# Patient Record
Sex: Female | Born: 1985 | Race: White | Hispanic: No | Marital: Single | Smoking: Never smoker
Health system: Southern US, Community
[De-identification: ages and names within clinical notes are randomized; demographics above are authoritative.]

## PROBLEM LIST (undated history)

## (undated) DIAGNOSIS — F429 Obsessive-compulsive disorder, unspecified: Secondary | ICD-10-CM

## (undated) DIAGNOSIS — F419 Anxiety disorder, unspecified: Secondary | ICD-10-CM

## (undated) DIAGNOSIS — H04129 Dry eye syndrome of unspecified lacrimal gland: Secondary | ICD-10-CM

## (undated) DIAGNOSIS — Z87898 Personal history of other specified conditions: Secondary | ICD-10-CM

## (undated) DIAGNOSIS — L709 Acne, unspecified: Secondary | ICD-10-CM

## (undated) HISTORY — DX: Dry eye syndrome of unspecified lacrimal gland: H04.129

## (undated) HISTORY — DX: Anxiety disorder, unspecified: F41.9

## (undated) HISTORY — DX: Acne, unspecified: L70.9

## (undated) HISTORY — DX: Obsessive-compulsive disorder, unspecified: F42.9

## (undated) HISTORY — DX: Personal history of other specified conditions: Z87.898

---
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
## 2005-02-17 ENCOUNTER — Other Ambulatory Visit: Admission: RE | Admit: 2005-02-17 | Discharge: 2005-02-17 | Payer: Self-pay | Admitting: Obstetrics and Gynecology

## 2005-03-24 ENCOUNTER — Ambulatory Visit (HOSPITAL_COMMUNITY): Admission: RE | Admit: 2005-03-24 | Discharge: 2005-03-24 | Payer: Self-pay | Admitting: Neurosurgery

## 2005-07-27 ENCOUNTER — Inpatient Hospital Stay (HOSPITAL_COMMUNITY): Admission: EM | Admit: 2005-07-27 | Discharge: 2005-08-06 | Payer: Self-pay | Admitting: Emergency Medicine

## 2006-03-19 ENCOUNTER — Encounter (INDEPENDENT_AMBULATORY_CARE_PROVIDER_SITE_OTHER): Payer: Self-pay | Admitting: *Deleted

## 2006-03-19 ENCOUNTER — Ambulatory Visit (HOSPITAL_BASED_OUTPATIENT_CLINIC_OR_DEPARTMENT_OTHER): Admission: RE | Admit: 2006-03-19 | Discharge: 2006-03-19 | Payer: Self-pay | Admitting: Obstetrics and Gynecology

## 2007-09-08 IMAGING — CT CT MAXILLOFACIAL WO/W CM
2 of 11 series · 14 of 37 positions shown, 17 images · IV contrast (omnipaque)
Comparison: CT head from 07/27/05 and 08/01/05.

CLINICAL DATA: Status post rhinoplasty with suspected CSF leak.
LUMBAR MYELOGRAM:
Contrast:  8 ml Omnipaque 180 intrathecal.
TECHNIQUE: Multidetector CT imaging of the lumbar spine was performed after intrathecal injection of contrast.  Multiplanar CT image reconstructions were also generated.
TECHNIQUE: CT maxillofacial was done with the patient prone and the chin extended.  Coronal images only were obtained before and after the administration of intrathecal contrast.

[Series 105: l-spine helical · axial · 0.27mm/px · z∈[-367,-200]mm · 12 of 317 slices shown, 15 images]
[im 22/317  brain]
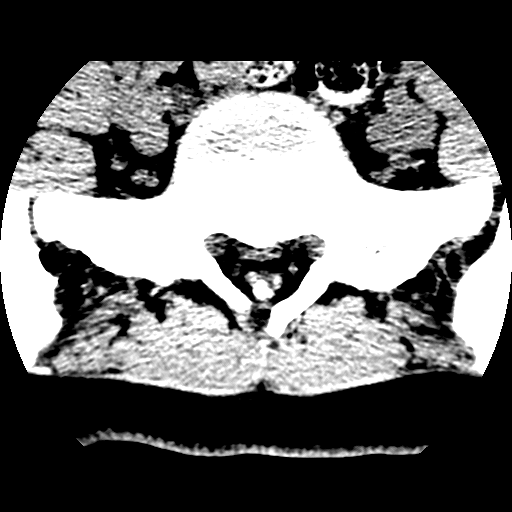
[im 22/317  bone]
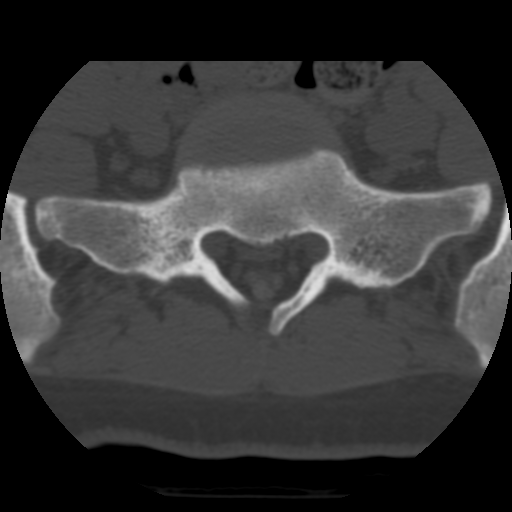
[im 43/317  bone]
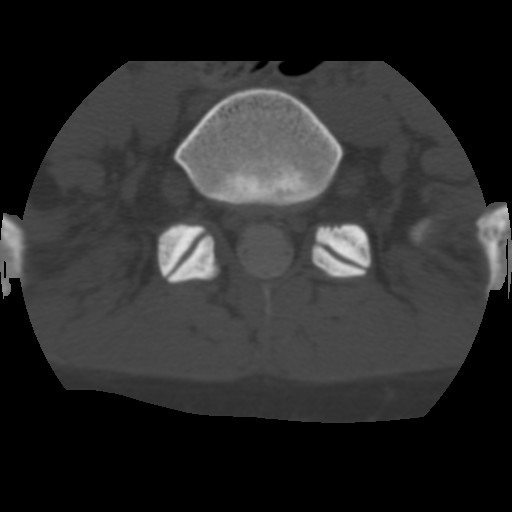
[im 64/317  bone]
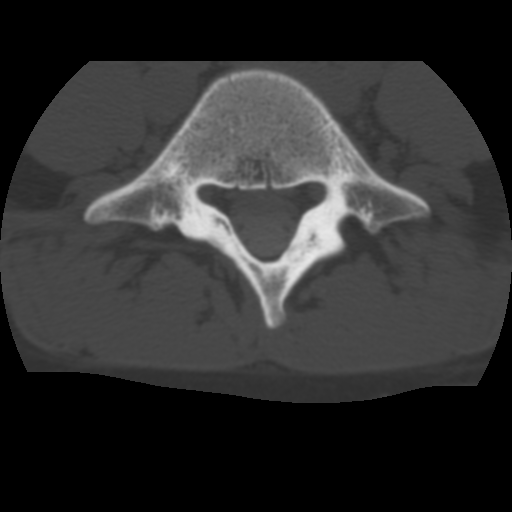
[im 106/317  bone]
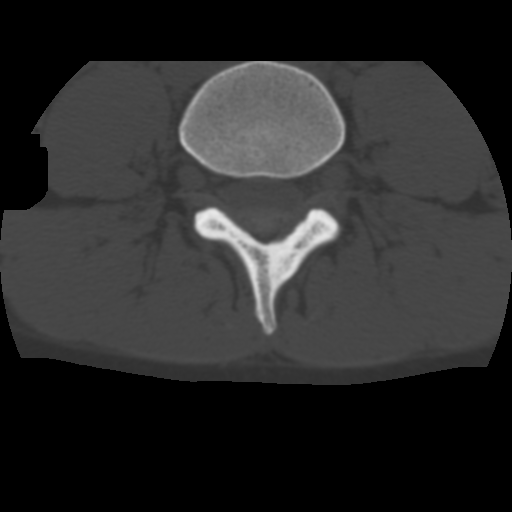
[im 127/317  brain]
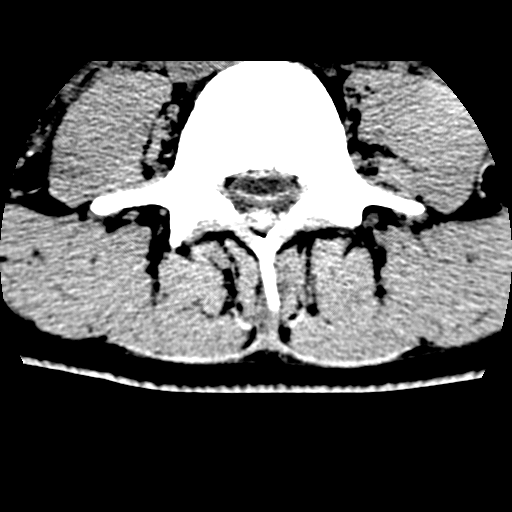
[im 127/317  bone]
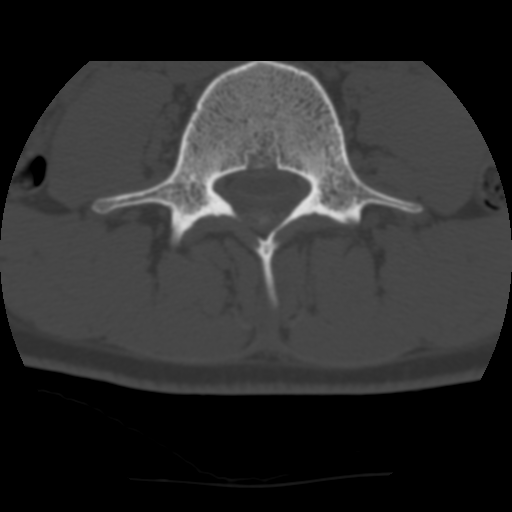
[im 148/317  bone]
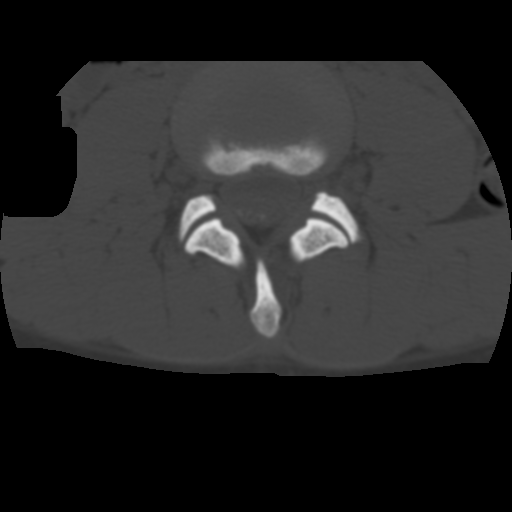
[im 169/317  bone]
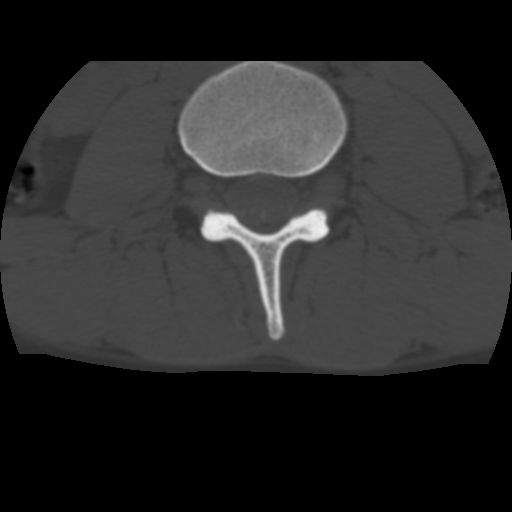
[im 190/317  bone]
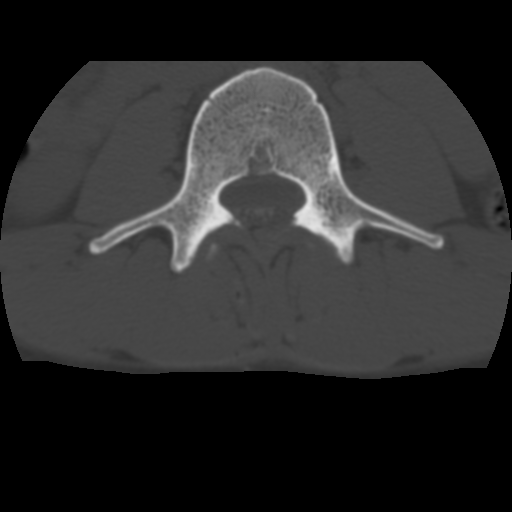
[im 211/317  brain]
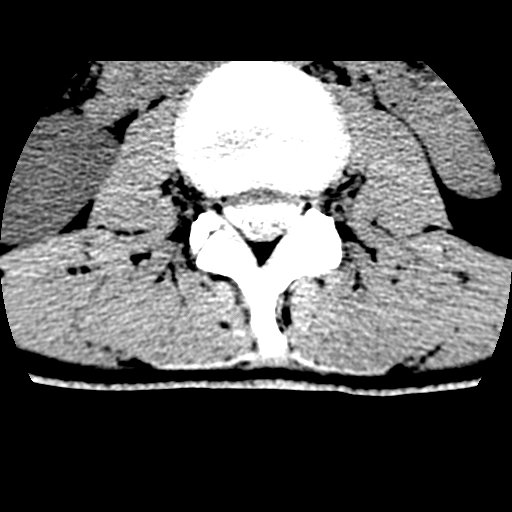
[im 211/317  bone]
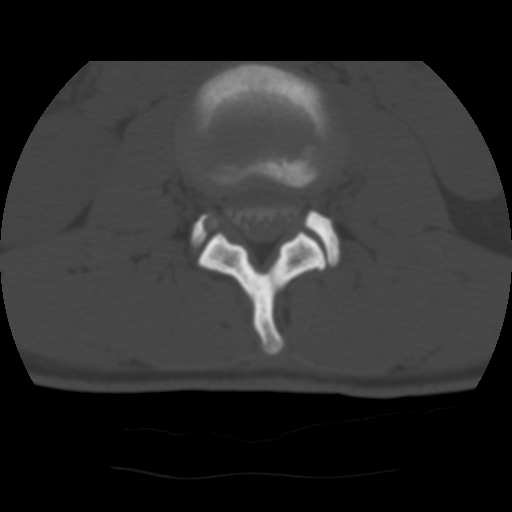
[im 253/317  bone]
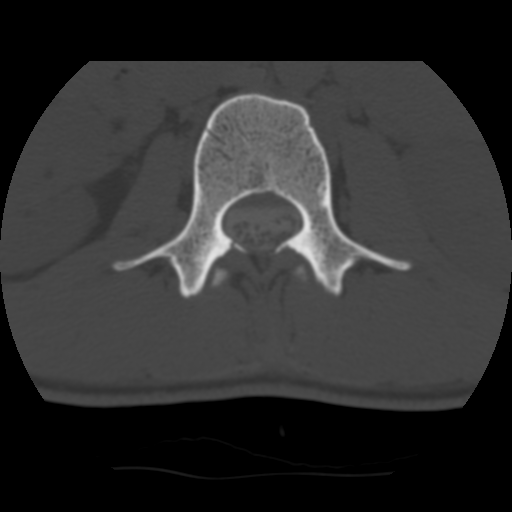
[im 274/317  bone]
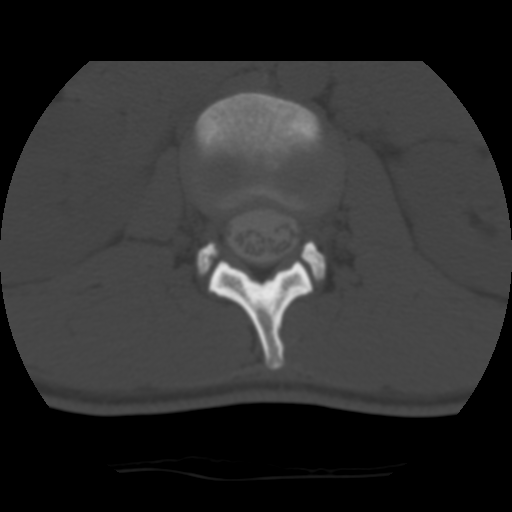
[im 295/317  bone]
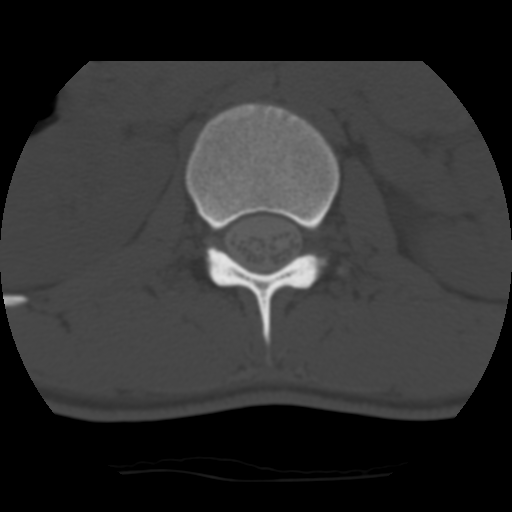

[Series 112: reformatted · sagittal · 0.38mm/px · 2 of 36 slices shown]
[im 12/36  bone]
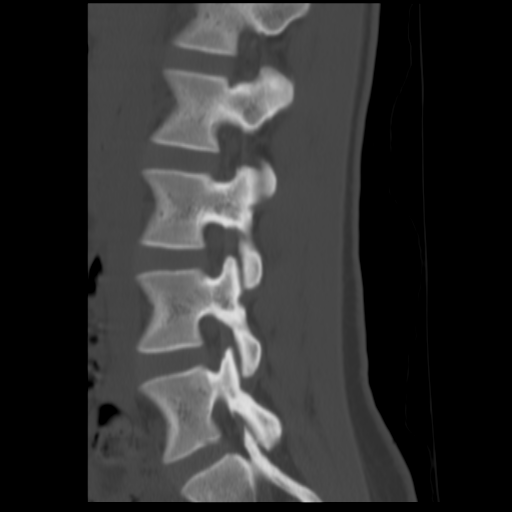
[im 24/36  bone]
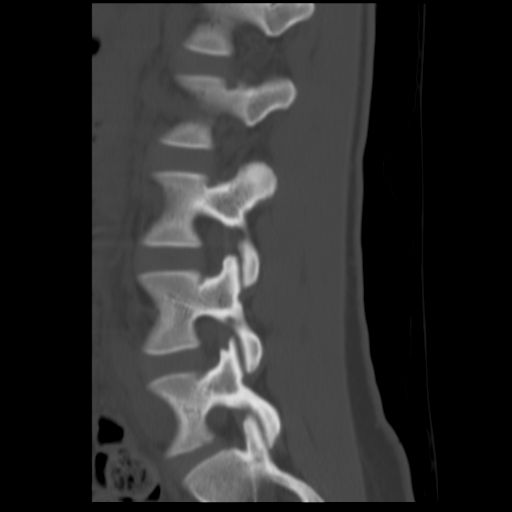

[14 of 37 positions shown; findings below may reference images not displayed]

FINDINGS: Lumbar myelography was performed to assess the possibility of spinal fluid leak from the lumbar drain site.  No leak was demonstrated.  The fluid was grossly bloody.  Four tubes were sent to the laboratory for analysis.  Contrast was maneuvered into the intracranial compartment for cisternography.  
CT LUMBAR SPINE WITH CONTRAST (POST-MYELOGRAM):
FINDINGS: There was good opacification of the subarachnoid space.  No spinal fluid leak was identified.
IMPRESSION: No evidence for lumbar CSF leak.
MAXILLOFACIAL CT WITHOUT AND WITH CONTRAST:
FINDINGS: Multiple areas of bony defect are seen in the cribriform plate bilaterally.  These are larger on the left than the right (image #105, series 102).  On the postcontrast images, there is evidence for leakage of spinal fluid into the ethmoid region (images #26 and #27, series 2).  The ethmoid cells are filled with fluid/blood which probably tamponades some of the CSF leak.  There is a bony spicule projecting superiorly on the left (images 101-107, series 102), likely tearing the dura and embedded into the frontal lobe.
Note is made of an air-fluid level in the left maxillary sinus.  The left frontal sinus is completely opacified.  There is significant fluid accumulation in the sphenoid with fracture of the median septum.  The bony nasal septum has been removed anteriorly.
IMPRESSION: 1.  Bilateral bony defects in the cribriform plate which are larger on the left than the right, as well as a superiorly projecting bone fragment on the left.
2.  Following opacification of the subarachnoid space with water soluble intrathecal contrast, there is evidence for leakage of spinal fluid bilaterally, more noticeable on the right.  
The report was called by myself to Dr. Pelobello at approximately [DATE] p.m. on 08/05/05.

## 2008-04-24 ENCOUNTER — Other Ambulatory Visit: Admission: RE | Admit: 2008-04-24 | Discharge: 2008-04-24 | Payer: Self-pay | Admitting: Obstetrics & Gynecology

## 2009-11-23 ENCOUNTER — Encounter: Admission: RE | Admit: 2009-11-23 | Discharge: 2009-11-23 | Payer: Self-pay | Admitting: Family Medicine

## 2010-12-23 NOTE — Discharge Summary (Signed)
Eileen Campbell, Eileen Campbell NO.:  [id number]   MEDICAL RECORD NO.:  [id number]          PATIENT TYPE:  INP   LOCATION:  3040                         FACILITY:  [facility]   PHYSICIAN:  Etter Sjogren, M.D.     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   HISTORY OF PRESENT ILLNESS:  This 25 year old woman complained of nasal  airway obstruction and nasal deformity. She had undergone a septorhinoplasty  two days prior to her admission here.  This was an outpatient procedure.  She had done very well from that although she did experience a fall at the  outpatient surgical center.  She went home, developed headache, and  developed some significant nausea with what her mother described as a  seizure.  She was asked to come back to the hospital where she was evaluated  and admitted for observation and placement of a lumbar drain.   HOSPITAL COURSE:  At the time of evaluation her CT scan did show some air  intracranially.  The actual site of the leak could not be identified.  Clinically she did not have any evidence of rhinorrhea.  Dr. Kerri Perches of  neurosurgery was consulted and he placed a lumbar drain.  She was placed on  bed rest for five days.  During that time she did have some headache, but  gradual steady clinical improvement.  After five days the drain was clamped.  She was out of bed to a chair and did fairly well, although she developed  some headache.  She was then transferred from the neurosurgery intensive  care unit to the floor.  On the floor she did develop some further headache  and some nausea.  This had gradually abated.  The lumbar drain was removed  and there was a question as to whether or not she was continuing to have  some leak either from that site  or from the cribriform plate area.  A  myelogram was performed and it was felt that there might be a leak in the  cribriform plate region although it is still possible that the area sealed.  She certainly has not had any clinical evidence of CSF rhinorrhea.  She  denies any salty taste in the back of her throat and there has not been any  fluid draining  from her nose.  She has been tested specifically for this  with bending forward and holding that position for several minutes.  To help  in the assessment, a consult was placed with Dr. Christia Reading of ENT.  He  saw her and discussed the clinical findings with her.  He has also  apparently consulted with Dr. Ronnald Ramp at [facility].  The feeling is that she would be safe to be discharged.  There has been no evidence of any infection and she could convalesce at home  safely.  The signs  and symptoms of meningitis have been discussed with the  family and the importance of communicating any evidence of a leak including  fluid draining from the nose or any salt taste in the back of the throat has  also been discussed.  The family would actually prefer to convalesce at home  rather than to spend further time here in the hospital at present.   Dr. Jenne Pane feels that there is a good chance this will go on to complete  healing.   PLAN:  Followup with Dr. Ronnald Ramp at [facility].  Dr.  Jenne Pane will be helping with those arrangements.  She will go home, no  straining.  She will be ambulatory, but no lifting, no activity, no  exertion, and  no straining.  She will be on a soft diet.  She will be discharged on  Darvocet-N 100 total of 30 given 1 p.o. q.4-6h. prior pain and Phenergan 25  mg a total of 15 given, one p.o. q.4h. p.r.n. nausea.  She will followup  with me in a couple of weeks.      Etter Sjogren, M.D.  Electronically Signed     DB/MEDQ  D:  08/06/2005  T:  08/07/2005  Job:   [id number]

## 2010-12-23 NOTE — Consult Note (Signed)
[name], Campbell NO.:  [id number]   MEDICAL RECORD NO.:  [id number]          PATIENT TYPE:  INP   LOCATION:  3040                         FACILITY:  [facility]   PHYSICIAN:  Antony Contras, MD     DATE OF BIRTH:  12/20/1985   DATE OF CONSULTATION:  08/05/2005  DATE OF DISCHARGE:  08/06/2005                                   CONSULTATION   CHIEF COMPLAINT:  Anterior skull base fracture following septoplasty.   HISTORY OF PRESENT ILLNESS:  The patient is a 25 year old white female who  underwent septorhinoplasty for septal deviation on December 19.  Two days  following the surgery, she had a seizure-like episode associated with  headache and came to the emergency department.  CT scanning of the head  revealed pneumocephalus and an apparent fracture of the cribriform plate.  A  neurosurgery consultation was obtained and she was admitted to the hospital.  She does state that she noted some drainage from her nose postoperatively  that initially was pink and then became yellow in color.  She thought this  was normal following surgery.  Once admitted, a lumbar drain was placed and  she was kept on bedrest and started on stool softener.  The lumbar drain was  kept in place for 5 days and was then removed.  Imaging of the head showed  improvement of pneumocephalus.  Since that time, she has had continued  headache initially upon sitting up.  A blood patch was performed at the  lumbar site.  Since that time, her headache has been better but has been  more steady today.  Because of continued headache, a CT of the face and  myelogram were performed today that continued to demonstrate a defect in the  cribriform plate, more so on the left but also slightly on the right side.  The myelogram shows what looks to be penetration of CSF fluid in through the  right-sided fracture and less so on the left side where there is soft tissue  density on the scan.  However, the patient has  not notices any salty taste  in her throat and notices no rhinorrhea.  Her spinal fluid was sent for  laboratories that show no evidence of meningitis but only red blood cells.  A consultation was called for consideration of a nasal approach for repair  of the cribriform plate.   PAST MEDICAL HISTORY:  None.   PAST SURGICAL HISTORY:  As above.   MEDICATIONS:  1.  Senokot.  2.  Colace.  3.  Zofran.  4.  Phenergan.  5.  Hydrocodone.   ALLERGIES:  DOXYCYCLINE.   FAMILY HISTORY:  Noncontributory.   SOCIAL HISTORY:  She denies smoking or alcohol use.   PHYSICAL EXAMINATION:  VITAL SIGNS:  Afebrile and vital signs stable.  GENERAL:  The patient is alert and in no acute distress.  She is interactive  and alert and oriented.  She is in a 30-degree head-up position but remains  in bed during the examination.  HEENT:  She has no nasal dressing on and has no bleeding from the  nose.  She  was placed into a sitting position with her head leaning forward for about 5  minutes and no rhinorrhea was demonstrated.  She did feel a feeling of  wetness inside of her nose but nothing drained out.   RADIOLOGIC EXAMS:  The CT of the face performed today along with myelogram  were personally reviewed and discussed with radiology.  This demonstrated a  sizeable left cribriform plate defect as well as a smaller defect on the  right side.  There is a fragment of bone sticking up on the left side  laterally and the crista galli and stability of the crista galli is unclear.  On myelogram, contrast appears to penetrate the right side more than the  left side where there is soft tissue density.   ASSESSMENT:  Eileen Campbell is a 25 year old white female with anterior skull  base defect following septoplasty with apparent cerebrospinal fluid leak,  now treated with 5 days of a lumbar drain and now with imaging suggesting  continued leak, however, without clinical signs.   PLAN:  Options were discussed at  length with the patient and her family.  Most CSF leaks following sinus or nasal surgery will heal with time or  lumbar drain at times.  Her defect does seem large with soft tissue density  on the left side that may represent dura or brain but also could represent  healing soft tissue.  Certainly with time, the chances of spontaneous  healing do decline, however, it is unclear whether she has a leak going on  at this time or not.  One option would be to continue observing the  situation, keeping the head of her bed elevated and maximizing bedrest.  This may allow the leak to seal off and would have a low risk of delayed  treatment and a very low risk of meningitis.  Surgical options include a  craniotomy with repair from above which would be the most definitive repair  but also associated with significant risk.  Repair with a nasal endoscopic  approach is possible, however, it is difficult to perform on the acutely  operated on nose and with the sizeable defect that possibly involves both  sides of the cribriform plate, the ability to isolate the defect for fascia  patching may be impossible from below.  At this point in time, I have  advised the patient and her family that continued observation with bedrest  and elevated head are low-risk options that still may result in healing.  In  the meantime, I will discuss the case with Dr. Pincus Badder at [facility] when he is available during this holiday  week to see if he would be interested in attempting repair through the nose  and to try to get an idea of proper timing of this repair.  Continued  waiting may result in an isolation of the CSF leak, making repair more  readily performed.  I will discuss these thoughts with Dr. Odis Luster and Dr.  Jeral Fruit and have talked at length with the family already.      Antony Contras, MD  Electronically Signed    DDB/MEDQ  D:  08/05/2005  T:  08/07/2005  Job:   [id number]

## 2010-12-23 NOTE — Op Note (Signed)
Eileen Campbell, Eileen Campbell            ACCOUNT NO.:  [id number]   MEDICAL RECORD NO.:  [id number]          PATIENT TYPE:  AMB   LOCATION:  NESC                         FACILITY:  [facility]   PHYSICIAN:  Cynthia P. Romine, M.D.DATE OF BIRTH:  05/09/1986   DATE OF PROCEDURE:  03/19/2006  DATE OF DISCHARGE:                                 OPERATIVE REPORT   PREOPERATIVE DIAGNOSIS:  Introital pain with tampon insertion, rule out  hymenal band, inadequate office exam.   POSTOPERATIVE DIAGNOSIS:  Normal examination.   PROCEDURE:  Examination under anesthesia and Pap smear.   SURGEON:  Dr. Arline Asp Romine.   ANESTHESIA:  General by LMA.   ESTIMATED BLOOD LOSS:  None.   COMPLICATIONS:  None.   PROCEDURE:  The patient was taken to the operating room and, after the  induction of adequate general anesthesia by LMA, she was placed in dorsal  lithotomy position, and gently prepped and draped in usual fashion.  Examination was carried out by taking a cotton swab and inserting it into  the introitus, and sweeping the introitus looking for any hymenal band.  None was noted.  The fifth finger of the operator's hand was then inserted  into the vagina, and the introitus and the vagina were palpated and felt to  be normal.  The patient was currently menstruating.  A small Peterson  speculum was inserted and the introitus and the vagina and the cervix were  visualized.  There was blood in the vault consistent with menstruation, and  the exam was otherwise normal.  A Scopette was used to remove the blood from  the posterior fornix, and a Pap smear was taken.  A bimanual examination was  then carried out.  The uterus was anterior, small and mobile.  The adnexa  were without masses.  One more time the examiner carefully examined the  introitus and again no hymenal band could be identified.  Procedure was  terminated.  The patient was taken to recovery room in satisfactory  condition.     ______________________________  Edwena Felty. Romine, M.D.     CPR/MEDQ  D:  03/19/2006  T:  03/19/2006  Job:  [id number]   cc:   Aram Beecham P. Romine, M.D.  Fax: [phone]

## 2010-12-23 NOTE — Op Note (Signed)
[name], [name]            ACCOUNT NO.:  [id number]   MEDICAL RECORD NO.:  [id number]          PATIENT TYPE:  INP   LOCATION:  3102                         FACILITY:  [facility]   PHYSICIAN:  Danae Orleans. Venetia Maxon, M.D.  DATE OF BIRTH:  1985/09/19   DATE OF PROCEDURE:  07/27/2005  DATE OF DISCHARGE:                                 OPERATIVE REPORT   PREOPERATIVE DIAGNOSIS:  Cerebrospinal fluid leak with base of skull  fracture.   POSTOPERATIVE DIAGNOSIS:  Cerebrospinal fluid leak with base of skull  fracture.   PROCEDURE:  Placement of lumbar drain.   SURGEON:  Danae Orleans. Venetia Maxon, M.D.   ANESTHESIA:  Sedation with local lidocaine.   COMPLICATIONS:  None.   DISPOSITION:  Recovery.   INDICATIONS:  Eileen Campbell is a 25 year old young woman who had a  septorhinoplasty and developed a headache and had a head CT which  demonstrated intracranial air and suggestion of a cribriform plate fracture.  It was elected to admit her to the hospital to the neuro ICU and to place a  lumbar drain for CSF diversion to allow the source of the leak to seal  without surgery. It was elected to take the patient to the operating room  for placement of the lumbar drain given her age and relative level of  anxiety.   PROCEDURE:  Eileen Campbell was brought to the operating room. Following the  initiation of intravenous sedation, she was placed in a left lateral  decubitus position with head tucked and knees pulled toward her chest. Her  low back was prepped and draped in the usual sterile fashion. After  infiltrating the skin and subcutaneous tissues with local lidocaine 1%  without epinephrine, a Tuohy needle was inserted into the thecal sac with  brisk return of CSF and a lumbar drain which had been previously assembled  with the stylet was placed without difficulty through the Tuohy needle and  threaded in the thecal sac. The patient had no discomfort while this was  being done. The needle was then  withdrawn and the stylet was withdrawn. The  drain was anchored with a single silk stitch and the remaining drain was  circularized and a sterile occlusive dressing was placed. The drain was then  hooked up to the drainage system and after the patient was transferred back  to the bed. The patient tolerated the procedure well without complications.      Danae Orleans. Venetia Maxon, M.D.  Electronically Signed     JDS/MEDQ  D:  07/27/2005  T:  07/30/2005  Job:  [id number]

## 2010-12-23 NOTE — Consult Note (Signed)
Eileen, Campbell            ACCOUNT NO.:  [id number]   MEDICAL RECORD NO.:  [id number]          PATIENT TYPE:  INP   LOCATION:  3102                         FACILITY:  [facility]   PHYSICIAN:  Danae Orleans. Venetia Maxon, M.D.  DATE OF BIRTH:  1985-09-20   DATE OF CONSULTATION:  07/27/2005  DATE OF DISCHARGE:                                   CONSULTATION   REASON FOR CONSULTATION:  Intracranial air following septorhinoplasty.   HISTORY OF ILLNESS:  Eileen Campbell is a 25 year old young woman status  post septorhinoplasty two days ago complaining of headache and had two  episodes of loss of consciousness which may have been vasovagal or seizure-  type events, which were associated with abnormal movement of both upper and  lower extremities and apparently rolling her eyes back in her head.  She did  not have any loss of bowel or bladder control and did not bite her tongue.  She apparently aroused pretty readily after these events with no evidence of  any postictal.  She complains of headache and she came to the emergency room  today for evaluation.  A head CT was obtained which showed a significant  amount of intracranial air with questionable cribriform plate fracture.   The patient is awake, alert and oriented on examination.  Speech is clear  and fluent speech.  She has intact short- and long-term memory.  Pupils are  equal, round and reactive to light.  Extraocular movements intact.  She  moves all extremities without any apparent drift.  Her neck is supple.  She  has mild photophobia.  I did not sit her up and lean her forward to see if  she leaks CSF.  She does note some fluid draining along the back of her  throat.   IMPRESSION:  Eileen Campbell is a 25 year old young woman with probable  cribriform plate fracture with CSF leak.  She is to have a trial of lumbar  drainage for approximately five days.  If no resolution of intracranial air,  then she will need fine-cut CT scan of the  skull base with possible  craniotomy and repair of the frontal floor.  I discussed with the patient  and with her parents who wish to proceed.  I would not start her on  antiepileptic medications at the present time as it is not clear that she  indeed had a seizure but that if she has a witnessed seizure, we will go  ahead with anticonvulsants.  I discussed this with Dr. Sharene Skeans who made  that recommendation.  The patient will be given prophylactic Ancef for her  drain and nasal splints and will not remove the nasal splints before their  earlier planned removal.  Will keep her head of bed at 30 degrees with a  lumbar drain and plan on five days' of lumbar drainage.      Danae Orleans. Venetia Maxon, M.D.  Electronically Signed     JDS/MEDQ  D:  07/27/2005  T:  07/30/2005  Job:  [id number]   cc:   Etter Sjogren, M.D.  Fax: [phone]

## 2010-12-23 NOTE — H&P (Signed)
NAMETISHIA, MAESTRE NO.:  [id number]   MEDICAL RECORD NO.:  [id number]          PATIENT TYPE:  INP   LOCATION:  3102                         FACILITY:  [facility]   PHYSICIAN:  Etter Sjogren, M.D.     DATE OF BIRTH:  01/19/1986   DATE OF ADMISSION:  07/27/2005  DATE OF DISCHARGE:                                HISTORY & PHYSICAL   CHIEF COMPLAINT:  Headache and seizure at home this morning.   HISTORY OF PRESENT ILLNESS:  A 25 year old woman that had a septorhinoplasty  2 days ago. She had done well but apparently had a seizure at home this  morning, according to her mom. She has had nausea yet feels hungry. She  complains of a headache. There is no visual disturbance. She has had very  minimal nasal drainage. She was asked to come to the emergency room for  evaluation.   PAST MEDICAL HISTORY:  Negative cardiac, renal, GI, pulmonary, diabetes,  hypertension.   PHYSICAL EXAMINATION:  GENERAL:  She is alert, oriented.  HEENT:  PERRL. EOMI. Nasal tapes are in place. There is minimal drainage.  LUNGS:  Clear to auscultation. Breath sounds equal bilaterally.  HEART:  Regular rate and rhythm without murmur. S1, S2 within normal limits.  ABDOMEN:  Benign.  EXTREMITIES:  Full range of motion, equal strength.   Review of CT shows some air within the cranium, possibly with a crack at the  left cribriform plate.   The plan is to admit for observation. Neurosurgical consultation has been  obtained and have discussed her case with Dr. Kerri Perches. He will see her  later today, but he is recommending admission, observation, and placement of  a lumbar drain.      Etter Sjogren, M.D.  Electronically Signed     DB/MEDQ  D:  07/27/2005  T:  07/28/2005  Job:  [id number]

## 2016-05-16 DIAGNOSIS — H04129 Dry eye syndrome of unspecified lacrimal gland: Secondary | ICD-10-CM

## 2016-05-16 DIAGNOSIS — Z87898 Personal history of other specified conditions: Secondary | ICD-10-CM

## 2016-05-16 DIAGNOSIS — F419 Anxiety disorder, unspecified: Secondary | ICD-10-CM

## 2016-05-16 DIAGNOSIS — L709 Acne, unspecified: Secondary | ICD-10-CM

## 2016-05-16 DIAGNOSIS — F429 Obsessive-compulsive disorder, unspecified: Secondary | ICD-10-CM

## 2016-05-29 ENCOUNTER — Ambulatory Visit: Payer: Self-pay | Admitting: Cardiovascular Disease

## 2016-06-21 ENCOUNTER — Encounter: Payer: Self-pay | Admitting: Cardiovascular Disease

## 2016-06-21 ENCOUNTER — Encounter (INDEPENDENT_AMBULATORY_CARE_PROVIDER_SITE_OTHER): Payer: Self-pay

## 2016-06-21 ENCOUNTER — Ambulatory Visit (INDEPENDENT_AMBULATORY_CARE_PROVIDER_SITE_OTHER): Payer: 59 | Admitting: Cardiovascular Disease

## 2016-06-21 VITALS — BP 124/68 | HR 63 | Ht 68.0 in | Wt 120.4 lb

## 2016-06-21 DIAGNOSIS — R002 Palpitations: Secondary | ICD-10-CM | POA: Insufficient documentation

## 2016-06-21 NOTE — Progress Notes (Signed)
Cardiology Office Note   Date:  06/21/2016   ID:  Eileen Campbell, DOB [birth year], MRN [id number]  PCP:  Boneta LucksJennifer Brown, NP  Cardiologist:   Kristeen MissPhilip Latysha Thackston, MD   Chief Complaint  Patient presents with  . Loss of Consciousness   Problem list 1. History of syncope 2. Anxiety 3. Obsessive-compulsive disorder   History of Present Illness: Eileen Campbell is a 30 y.o. female who presents for further evaluations of some palpitations and syncope/presyncope.   She has seen Dr. Donnie Ahoilley in the past.  She was having a "sinking " sensation that started 3 years ago .  The episodes last 2-3 seconds , then goes away.   May have some increased anxiety with the episode but no CP or dyspnea . 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   Not associated with her periods.   Seems to occur during the day or early evening .    She does not exercise regularly .  She does do sprints on occasion (does 1 minute sprints ,  Lifts weights on occasion )   Not related to ETOH intake  Drinks lots of coffee - at least 2 shots of coffee every am and then  Drinks regular coffee through the day   Does not sleep or eat on  A regular schedule    Past Medical History:  Diagnosis Date  . Acne   . Anxiety   . Dry eye syndrome   . History of syncope   . Obsessive compulsive disorder     History reviewed. No pertinent surgical history.   Current Outpatient Prescriptions  Medication Sig Dispense Refill  . clonazePAM (KLONOPIN) 0.5 MG tablet Take 0.25 mg by mouth every morning. Take 1 tablet by mouth at night    . Desvenlafaxine Succinate ER (PRISTIQ) 25 MG TB24 Take 75 mg by mouth every morning.    Marland Kitchen. omeprazole (PRILOSEC) 40 MG capsule Take 40 mg by mouth daily as needed.    Marland Kitchen. spironolactone (ALDACTONE) 50 MG tablet Take 50 mg by mouth daily.      Marland Kitchen. tretinoin (RETIN-A) 0.05 % cream Apply 1 application topically at bedtime. To face     No current facility-administered medications for this visit.     Allergies:   Doxycycline    Social History:  The patient  reports that she has never smoked. She has never used smokeless tobacco.   Family History:  The patient's family history includes Diabetes in her father; Heart attack in her father; Hypertension in her father.    ROS:  Please see the history of present illness.    Review of Systems: Constitutional:  denies fever, chills, diaphoresis, appetite change and fatigue.  HEENT: denies photophobia, eye pain, redness, hearing loss, ear pain, congestion, sore throat, rhinorrhea, sneezing, neck pain, neck stiffness and tinnitus.  Respiratory: denies SOB, DOE, cough, chest tightness, and wheezing.  Cardiovascular: admits to chest pain, palpitations and leg swelling.  Gastrointestinal: denies nausea, vomiting, abdominal pain, diarrhea, constipation, blood in stool.  Genitourinary: denies dysuria, urgency, frequency, hematuria, flank pain and difficulty urinating.  Musculoskeletal: denies  myalgias, back pain, joint swelling, arthralgias and gait problem.   Skin: denies pallor, rash and wound.  Neurological: denies dizziness, seizures, syncope, weakness, light-headedness, numbness and headaches.   Hematological: denies  adenopathy, easy bruising, personal or family bleeding history.  Psychiatric/ Behavioral: denies suicidal ideation, mood changes, confusion, nervousness, sleep disturbance and agitation.       All other systems are reviewed and negative.    PHYSICAL EXAM: VS:  BP 124/68   Pulse 63   Ht 5\' 8"  (1.727 m)   Wt 120 lb 6.4 oz (54.6 kg)   BMI 18.31 kg/m  , BMI Body mass index is 18.31 kg/m. GEN: Well nourished, well developed, in no acute distress  HEENT: normal  Neck: no JVD, carotid bruits, or masses Cardiac: RRR; soft systolic murmur , rubs, or gallops,no edema   Respiratory:  clear to auscultation bilaterally, normal work of breathing GI: soft, nontender, nondistended, + BS MS: no deformity or atrophy  Skin: warm and dry, no rash Neuro:  Strength and sensation are intact Psych: normal   EKG:  EKG is ordered today. The ekg ordered today demonstrates   NSR at 63.   RAD    Recent Labs: No results found for requested labs within last 8760 hours.    Lipid Panel No results found for: CHOL, TRIG, HDL, CHOLHDL, VLDL, LDLCALC, LDLDIRECT    Wt Readings from Last 3 Encounters:  06/21/16 120 lb 6.4 oz (54.6 kg)      Other studies Reviewed: Additional studies/ records that were reviewed today include: . Review of the above records demonstrates:    ASSESSMENT AND PLAN:  1.  Palpitations: Eileen Campbell  presents with palpitations that sound like premature ventricular contractions or perhaps premature atrial contractions. She works very hectic and un-regimented work and life schedule. 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  I've recommended that she make sure that she's getting enough salt and protein in her diet. I've recommended that she drink V8 juice every morning.  I've recommended that she try to sleep for 7 date hours at night on a regular basis We'll check a TSH today.  The palpitations improved with these measures then we will not have to do anything further. We'll discuss this further in 3 months.  I'll see her again in 3 months for a follow-up visit.   Current medicines are reviewed at length with the patient today.  The patient does not have concerns regarding medicines.  Labs/ tests ordered today include:  No orders of the defined types were placed in this encounter.    Disposition:   FU with 3 months      Kristeen MissPhilip Alyjah Lovingood, MD  06/21/2016 3:02 PM    [facility] [address], [place], [place]  [id number] Phone: [phone]; Fax: [phone]

## 2016-06-21 NOTE — Patient Instructions (Addendum)
Increase your intake of fluids (water with electrolyte tabs like Nun tablets, or gatorade) , protein ( hard boiled eggs, chicken, fish) , and a electrolytes ( V-8 juice, salt, potassium chloride  which is sold as No-Salt  Medication Instructions:  Your physician recommends that you continue on your current medications as directed. Please refer to the Current Medication list given to you today.   Labwork: TODAY - Thyroid stimulating hormone   Testing/Procedures: None Ordered   Follow-Up: Your physician recommends that you schedule a follow-up appointment in: 3 months with Dr. Elease HashimotoNahser.    If you need a refill on your cardiac medications before your next appointment, please call your pharmacy.   Thank you for choosing [facility]! Eligha BridegroomMichelle Swinyer, RN [phone]

## 2016-06-22 LAB — TSH: TSH: 1.06 mIU/L

## 2016-07-06 ENCOUNTER — Telehealth: Payer: Self-pay | Admitting: Cardiovascular Disease

## 2016-07-06 ENCOUNTER — Other Ambulatory Visit: Payer: Self-pay

## 2016-07-06 ENCOUNTER — Other Ambulatory Visit: Payer: 59 | Admitting: *Deleted

## 2016-07-06 DIAGNOSIS — R002 Palpitations: Secondary | ICD-10-CM

## 2016-07-06 LAB — BASIC METABOLIC PANEL
BUN: 14 mg/dL (ref 7–25)
CO2: 24 mmol/L (ref 20–31)
Calcium: 9.4 mg/dL (ref 8.6–10.2)
Chloride: 105 mmol/L (ref 98–110)
Creat: 0.77 mg/dL (ref 0.50–1.10)
Glucose, Bld: 80 mg/dL (ref 65–99)
Potassium: 4.2 mmol/L (ref 3.5–5.3)
Sodium: 138 mmol/L (ref 135–146)

## 2016-07-06 NOTE — Telephone Encounter (Signed)
New Message °

## 2016-07-06 NOTE — Telephone Encounter (Signed)
Desk called and said patient called and asked if she needed labs today. Ordered a BMET for her today per notes on 11/16 and 11/17 (see below). Told them to tell her to come today to get her labs.  Notes Recorded by Levi AlandMichelle M Swinyer, RN on 06/23/2016 at 11:52 AM EST Reviewed results with patient who verbalized understanding. She states she will call back next week to schedule lab appointment and is aware to make that appointment for 2-3 weeks. She thanked me for the call. ------  Notes Recorded by Levi AlandMichelle M Swinyer, RN on 06/22/2016 at 12:08 PM EST Attempted to call patient w/result, unable to leave message. Patient needs to be scheduled for bmet in 2-3 weeks per Dr. Elease HashimotoNahser for evaluation of K+ level. ------

## 2016-07-13 ENCOUNTER — Encounter: Payer: Self-pay | Admitting: Nurse Practitioner

## 2016-07-14 ENCOUNTER — Telehealth: Payer: Self-pay | Admitting: Cardiovascular Disease

## 2016-07-14 NOTE — Telephone Encounter (Signed)
Eileen Campbell is returning a call . Thanks

## 2016-07-17 NOTE — Telephone Encounter (Signed)
Results of bmet reviewed with patient who verbalized understanding and agreement to continue current medications.

## 2016-10-02 ENCOUNTER — Ambulatory Visit: Payer: 59 | Admitting: Cardiovascular Disease

## 2016-10-03 ENCOUNTER — Encounter: Payer: Self-pay | Admitting: Cardiovascular Disease

## 2017-01-16 ENCOUNTER — Ambulatory Visit (INDEPENDENT_AMBULATORY_CARE_PROVIDER_SITE_OTHER): Payer: 59 | Admitting: Physician Assistant

## 2017-01-16 ENCOUNTER — Encounter: Payer: Self-pay | Admitting: Physician Assistant

## 2017-01-16 VITALS — BP 107/67 | HR 82 | Temp 98.4°F | Resp 18 | Ht 68.0 in | Wt 131.2 lb

## 2017-01-16 DIAGNOSIS — Z79899 Other long term (current) drug therapy: Secondary | ICD-10-CM | POA: Diagnosis not present

## 2017-01-16 DIAGNOSIS — F429 Obsessive-compulsive disorder, unspecified: Secondary | ICD-10-CM

## 2017-01-16 DIAGNOSIS — G44221 Chronic tension-type headache, intractable: Secondary | ICD-10-CM | POA: Diagnosis not present

## 2017-01-16 DIAGNOSIS — F419 Anxiety disorder, unspecified: Secondary | ICD-10-CM | POA: Diagnosis not present

## 2017-01-16 MED ORDER — DESVENLAFAXINE SUCCINATE ER 100 MG PO TB24: 200.0000 mg | ORAL_TABLET | Freq: Every day | ORAL | 0 refills | Status: DC
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
## 2017-01-16 NOTE — Patient Instructions (Signed)
     IF you received an x-ray today, you will receive an invoice from [facility]. Please contact  Radiology at [phone] with questions or concerns regarding your invoice.   IF you received labwork today, you will receive an invoice from [facility]. Please contact [facility] at [phone] with questions or concerns regarding your invoice.   Our billing staff will not be able to assist you with questions regarding bills from these companies.  You will be contacted with the lab results as soon as they are available. The fastest way to get your results is to activate your My Chart account. Instructions are located on the last page of this paperwork. If you have not heard from us regarding the results in 2 weeks, please contact this office.     

## 2017-04-17 ENCOUNTER — Ambulatory Visit: Payer: 59 | Admitting: Physician Assistant

## 2017-04-29 ENCOUNTER — Other Ambulatory Visit: Payer: Self-pay | Admitting: Physician Assistant

## 2017-04-29 DIAGNOSIS — F429 Obsessive-compulsive disorder, unspecified: Secondary | ICD-10-CM

## 2017-06-13 ENCOUNTER — Other Ambulatory Visit: Payer: Self-pay | Admitting: Physician Assistant

## 2017-06-13 DIAGNOSIS — F429 Obsessive-compulsive disorder, unspecified: Secondary | ICD-10-CM

## 2017-06-13 NOTE — Telephone Encounter (Signed)
Please advise/refill desvenlafaxine (PRISTIQ)

## 2017-06-13 NOTE — Telephone Encounter (Signed)
Copied from CRM [id number]. Topic: Inquiry >> Jun 13, 2017  6:09 PM Alexander BergeronBarksdale, Harvey B wrote: Reason for CRM: PT called to get her Rx of Pristiq called in which was suppose to be ordered today

## 2017-06-14 ENCOUNTER — Telehealth: Payer: Self-pay | Admitting: Physician Assistant

## 2017-06-14 NOTE — Telephone Encounter (Signed)
Copied from CRM [id number]. Topic: Quick Communication - See Telephone Encounter >> Jun 14, 2017  5:26 PM Viviann SpareWhite, Selina wrote: CRM for notification. See Telephone encounter for: 06/14/17.  Pt mother call again requesting a refill on Rx for desvenlafaxine (Pristiq) 100 mg. Would like a return call advising when it will be refilled.

## 2017-06-15 ENCOUNTER — Telehealth: Payer: Self-pay | Admitting: *Deleted

## 2017-06-15 DIAGNOSIS — F429 Obsessive-compulsive disorder, unspecified: Secondary | ICD-10-CM

## 2017-06-15 MED ORDER — DESVENLAFAXINE SUCCINATE ER 100 MG PO TB24: 200.0000 mg | ORAL_TABLET | Freq: Every day | ORAL | 1 refills | Status: DC

## 2017-06-15 NOTE — Telephone Encounter (Signed)
Done in a 90d supply.

## 2017-06-15 NOTE — Telephone Encounter (Signed)
Pt advised Rx for Pristiq 100 mg change to 90 days w/ 1 refill

## 2017-06-15 NOTE — Telephone Encounter (Signed)
This was done on 11/7.

## 2017-08-13 NOTE — Telephone Encounter (Signed)
error 

## 2017-10-02 ENCOUNTER — Ambulatory Visit (INDEPENDENT_AMBULATORY_CARE_PROVIDER_SITE_OTHER): Payer: 59 | Admitting: Physician Assistant

## 2017-10-02 ENCOUNTER — Encounter: Payer: Self-pay | Admitting: Physician Assistant

## 2017-10-02 ENCOUNTER — Other Ambulatory Visit: Payer: Self-pay

## 2017-10-02 VITALS — BP 106/70 | HR 102 | Temp 98.0°F | Resp 18 | Ht 68.0 in | Wt 138.6 lb

## 2017-10-02 DIAGNOSIS — F429 Obsessive-compulsive disorder, unspecified: Secondary | ICD-10-CM

## 2017-10-02 DIAGNOSIS — M5124 Other intervertebral disc displacement, thoracic region: Secondary | ICD-10-CM | POA: Diagnosis not present

## 2017-10-02 DIAGNOSIS — Z79899 Other long term (current) drug therapy: Secondary | ICD-10-CM

## 2017-10-02 DIAGNOSIS — G44229 Chronic tension-type headache, not intractable: Secondary | ICD-10-CM | POA: Diagnosis not present

## 2017-10-02 DIAGNOSIS — F419 Anxiety disorder, unspecified: Secondary | ICD-10-CM | POA: Diagnosis not present

## 2017-10-02 MED ORDER — CYCLOBENZAPRINE HCL 5 MG PO TABS: 5.0000 mg | ORAL_TABLET | Freq: Every day | ORAL | 1 refills | Status: DC

## 2017-10-02 NOTE — Progress Notes (Signed)
Eileen Campbell  MRN: [id number] DOB: 1985-10-22  PCP: Morrell Riddle, PA-C  Chief Complaint  Patient presents with  . Medication Refill    medication check  . Depression    screening was a 13     Subjective:  Pt presents to clinic for medication recheck.  Pt has a list of questions and concerns.  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 Looked into accupuncture with integrative therapy but she needed a GAP exception but she did not pursue it - she has not done PT recently but has in the past and not really sure that it helped.  She gets massages and that helps her headaches a lot.  She has started [facility] light therapy - she has had good results with her jaw pain and her neck pain - tightness in the muscles and plans to continue doing that - it is expensive so she wishes she could do it more  Tired - most of the day in bed - sleeps about 12 hours -- does not exercise - she is more interested in sleeping than doing other things - she has slept away pain in the past - she does not think that she sleeps to protect herself from anxiety  Intermittent blurred vision - saw eye MD - she was told she needs glasses - she understood that there was something on the surface on the eye -- she had a 2nd opinion she got diagnosed with dry eyes and had plugs placed about 4 days ago - much better - she was worried that it might be her medications  Anxiety overall - controlled in general  everyday anxiety but she still has triggers that flair her OCD.   Mom feels like she is on to much medications - mom is worried about side effects of pristiq.  Seeing Dr Ledon Snare - rare visits  - only 3 times since July   History is obtained by patient.  Review of Systems  Neurological: Positive for headaches (chronic daily).  Psychiatric/Behavioral: Positive for dysphoric mood and sleep disturbance (to much sleep). The patient is nervous/anxious.     Patient Active Problem List   Diagnosis Date Noted  . Herniated thoracic disc without myelopathy 10/02/2017  . Palpitations 06/21/2016  . Dry eye syndrome   . Anxiety   . History of syncope   . Acne   . Obsessive compulsive disorder     Current Outpatient Medications on File Prior to Visit  Medication Sig Dispense Refill  . clonazePAM (KLONOPIN) 0.5 MG tablet Take 0.25 mg by mouth at bedtime. Take 1 tablet by mouth at night    . desvenlafaxine (PRISTIQ) 100 MG 24 hr tablet Take 2 tablets (200 mg total) daily by mouth. 180 tablet 1  . spironolactone (ALDACTONE) 50 MG tablet Take 25 mg by mouth daily.     Marland Kitchen  tretinoin (RETIN-A) 0.1 % cream Apply 1 application topically at bedtime. To face     No current facility-administered medications on file prior to visit.     Allergies  Allergen Reactions  . Doxycycline Other (See Comments)    Sun reaction    Past Medical History:  Diagnosis Date  . Acne   . Anxiety   . Dry eye syndrome   . History of syncope   . Obsessive compulsive disorder    Social History   Social History Narrative  . Not on file   Social History   Tobacco Use  . Smoking status: Never Smoker  . Smokeless tobacco: Never Used  Substance Use Topics  . Alcohol use: Not on file  . Drug use: Not on file   family history includes Diabetes in her father; Heart attack in her father; Hypertension in her father.     Objective:  BP 106/70   Pulse (!) 102   Temp 98 F (36.7 C) (Oral)   Resp 18   Ht 5\' 8"   (1.727 m)   Wt 138 lb 9.6 oz (62.9 kg)   LMP 09/16/2017   SpO2 98%   BMI 21.07 kg/m  Body mass index is 21.07 kg/m.  Physical Exam  Constitutional: She is oriented to person, place, and time and well-developed, well-nourished, and in no distress.  HENT:  Head: Normocephalic and atraumatic.  Right Ear: Hearing and external ear normal.  Left Ear: Hearing and external ear normal.  Eyes: Conjunctivae are normal.  Neck: Normal range of motion.  Cardiovascular: Normal rate, regular rhythm and normal heart sounds.  No murmur heard. Pulmonary/Chest: Effort normal and breath sounds normal. She has no wheezes.  Musculoskeletal:       Cervical back: She exhibits spasm (bilateral trapezius spasms that go into the neck bilaterally - tension spots in neck that are palpable).  Neurological: She is alert and oriented to person, place, and time. Gait normal.  Skin: Skin is warm and dry.  Psychiatric: Mood, memory, affect and judgment normal.  Flat affect, slow speech - refers to her phone for most thoughts - writes down things that we talk about on her phone  Vitals reviewed.  Spent 45 mins with the patient - greater than 50% of the visit was face to face counseling patient regarding her current symptoms and steps in her treatment plan.   Assessment and Plan :  Chronic tension-type headache, not intractable - Plan: cyclobenzaprine (FLEXERIL) 5 MG tablet - likely related to many things - muscle spasms, anxiety, teeth grinding.clenching.  Pt to get bite guard as suggested by her dentist.  Back to PT and maybe accupunture - d/w pt other alternative treatments to her headaches - she feels like they are worse but upon review of her chart she was having the same complaints at her last visit and the patient has not done what was suggested.  Willing to send to neurologist but that will likely lead to additional medications which the patient is hesitant about (understandabley so) and she has gotten some relief  from muscle work so we will add the muscle relaxer and recheck her a month to see how that helps.    Herniated thoracic disc without myelopathy - ? One of the causes of her muscle spasms -   Obsessive-compulsive disorder, unspecified type - still having problems with it - encouraged pt to get back into therapy - we did talk about if the patient is having financial trouble with her current  therapist to talk with him and then if still a problem finding one in his insurance network.  Medication management - continue current medications - I did d/w pt to talk with therapist and at her next appt we could try to decrease her pristiq but with her anxiety still so high I am hesitant.  Anxiety - wonder if she could get better control - esp with her OCD - we talked about Namenda in the past but she is not sure she is interested - we discussed that yes she is on a high dose of medication but she has needed that to control her symptoms to where they are today.  We need closer f/u for her multiple medical problems.   Benny LennertSarah Weber PA-C  Primary Care at [facility] 10/05/2017 5:54 PM

## 2017-10-02 NOTE — Patient Instructions (Addendum)
Therapist for OCD that might take insurance 1- [facility] --- Vernell LeepKen Frazier  [facility] -- too look into for neck pain, muscle spasms and migraines/headaches  Integrative therapies -- look into for accupuncture/PT for headaches  IF you received an x-ray today, you will receive an invoice from [facility]. Please contact [facility] at [phone] with questions or concerns regarding your invoice.   IF you received labwork today, you will receive an invoice from [facility]. Please contact [facility] at [phone] with questions or concerns regarding your invoice.   Our billing staff will not be able to assist you with questions regarding bills from these companies.  You will be contacted with the lab results as soon as they are available. The fastest way to get your results is to activate your My Chart account. Instructions are located on the last page of this paperwork. If you have not heard from us regarding the results in 2 weeks, please contact this office.

## 2017-10-05 ENCOUNTER — Encounter: Payer: Self-pay | Admitting: Physician Assistant

## 2017-11-02 ENCOUNTER — Encounter: Payer: Self-pay | Admitting: Physician Assistant

## 2017-11-02 ENCOUNTER — Ambulatory Visit: Payer: 59 | Admitting: Physician Assistant

## 2017-11-02 VITALS — BP 102/64 | HR 89 | Temp 98.5°F | Resp 18 | Ht 69.09 in | Wt 135.0 lb

## 2017-11-02 DIAGNOSIS — G44229 Chronic tension-type headache, not intractable: Secondary | ICD-10-CM | POA: Diagnosis not present

## 2017-11-02 DIAGNOSIS — F419 Anxiety disorder, unspecified: Secondary | ICD-10-CM | POA: Diagnosis not present

## 2017-11-02 DIAGNOSIS — F429 Obsessive-compulsive disorder, unspecified: Secondary | ICD-10-CM

## 2017-11-02 MED ORDER — DESVENLAFAXINE SUCCINATE ER 100 MG PO TB24: 200.0000 mg | ORAL_TABLET | Freq: Every day | ORAL | 0 refills | Status: DC

## 2017-11-02 MED ORDER — CLONAZEPAM 0.5 MG PO TABS: 0.2500 mg | ORAL_TABLET | Freq: Every day | ORAL | 0 refills | Status: DC

## 2017-11-02 MED ORDER — CYCLOBENZAPRINE HCL 5 MG PO TABS: 5.0000 mg | ORAL_TABLET | Freq: Three times a day (TID) | ORAL | 1 refills | Status: AC | PRN

## 2017-11-02 NOTE — Patient Instructions (Signed)
     IF you received an x-ray today, you will receive an invoice from [facility]. Please contact [facility] at [phone] with questions or concerns regarding your invoice.   IF you received labwork today, you will receive an invoice from [facility]. Please contact [facility] at [phone] with questions or concerns regarding your invoice.   Our billing staff will not be able to assist you with questions regarding bills from these companies.  You will be contacted with the lab results as soon as they are available. The fastest way to get your results is to activate your My Chart account. Instructions are located on the last page of this paperwork. If you have not heard from us regarding the results in 2 weeks, please contact this office.     

## 2017-11-02 NOTE — Progress Notes (Signed)
Eileen Campbell  MRN: [id number] DOB: [birth year]  PCP: Eileen Riddle, PA-C  Chief Complaint  Patient presents with  . Medication Management    Flexeril    Subjective:  Pt presents to clinic for re  check of headaches.Flexeril has really helped with her headaches - she is mainly taking it at night - it has helped a lot -- she has had 4 headaches in the last month compared to 4-5 a week - they are less severe when she does get them.  When she does get a headache she can identify why she gets one - like she forgets her retainers and grinds her teeth she will have a headache the next day.  Reduced use of NSAIDs from 3-5 times a week to less than once a week.  Since June she has taken klonopin 6 times - really bad anxiety attacks - she really saves them but what she has at home is expired.  Still seeing Dr Ledon Snare for her OCD and anxiety.  She has stopped drinking ETOH during there last month - she was drinking about 7 a week - 3 each day maximum.  History is obtained by patient.  Review of Systems  Constitutional: Negative for chills and fever.  Musculoskeletal:       Less cervical and back pain since the start of flexeril  Neurological: Positive for headaches (much improved).    Patient Active Problem List   Diagnosis Date Noted  . Herniated thoracic disc without myelopathy 10/02/2017  . Palpitations 06/21/2016  . Dry eye syndrome   . Anxiety   . History of syncope   . Acne   . Obsessive compulsive disorder     Current Outpatient Medications on File Prior to Visit  Medication Sig Dispense Refill  . tretinoin (RETIN-A) 0.1 % cream Apply 1 application topically at bedtime. To face    . spironolactone (ALDACTONE) 50 MG tablet Take 25 mg by mouth daily.      No current facility-administered medications on file prior to visit.     Allergies  Allergen Reactions  . Doxycycline Other (See Comments)    Sun reaction    Past Medical History:  Diagnosis Date  . Acne   .  Anxiety   . Dry eye syndrome   . History of syncope   . Obsessive compulsive disorder    Social History   Social History Narrative  . Not on file   Social History   Tobacco Use  . Smoking status: Never Smoker  . Smokeless tobacco: Never Used  Substance Use Topics  . Alcohol use: Yes    Alcohol/week: 1.8 oz    Types: 1 Glasses of wine, 2 Shots of liquor per week  . Drug use: Never   family history includes Diabetes in her father; Heart attack in her father; Hypertension in her father.     Objective:  BP 102/64 (BP Location: Left Arm, Patient Position: Sitting, Cuff Size: Normal)   Pulse 89   Temp 98.5 F (36.9 C) (Oral)   Resp 18   Ht 5' 9.09" (1.755 m)   Wt 135 lb (61.2 kg)   LMP 10/19/2017 (Approximate)   SpO2 97%   BMI 19.88 kg/m  Body mass index is 19.88 kg/m.  Physical Exam  Constitutional: She is oriented to person, place, and time and well-developed, well-nourished, and in no distress.  HENT:  Head: Normocephalic and atraumatic.  Right Ear: Hearing and external ear normal.  Left Ear: Hearing and external  ear normal.  Eyes: Conjunctivae are normal.  Neck: Normal range of motion.  Cardiovascular: Normal rate, regular rhythm and normal heart sounds.  No murmur heard. Pulmonary/Chest: Effort normal and breath sounds normal. She has no wheezes.  Musculoskeletal:       Cervical back: She exhibits spasm (less palpable and less TTP).  Neurological: She is alert and oriented to person, place, and time. Gait normal.  Skin: Skin is warm and dry.  Psychiatric: Mood, memory, affect and judgment normal.  Vitals reviewed.  Assessment and Plan :  Chronic tension-type headache, not intractable - Plan: cyclobenzaprine (FLEXERIL) 5 MG tablet - great relief with muscle relaxer - she will continue for another month and then she will try to wean - she will continue her other modalities of massage and light therapy  Obsessive-compulsive disorder, unspecified type - Plan:  clonazePAM (KLONOPIN) 0.5 MG tablet, desvenlafaxine (PRISTIQ) 100 MG 24 hr tablet - she will f/u in 6 weeks - at that time she would like to try and reduce her pristiq she is not sure the increased dose is helping and she is worried about side effects  Anxiety - Plan: clonazePAM (KLONOPIN) 0.5 MG tablet - rare use only for extreme anxiety conditions  Benny LennertSarah Weber PA-C  [facility] 11/02/2017 6:37 PM

## 2017-11-08 ENCOUNTER — Telehealth: Payer: Self-pay | Admitting: Physician Assistant

## 2017-11-08 NOTE — Telephone Encounter (Signed)
Called to try and reschedule pt appt from 12/11/17 . Benny LennertSarah Weber will not be in the office that day. I could not leave message due to VMailbox being full.   If pt calls back in, please have her reschedule her appt with Benny LennertSarah Weber at her convenience.  Thanks!

## 2017-11-20 ENCOUNTER — Encounter: Payer: Self-pay | Admitting: Physician Assistant

## 2017-12-04 ENCOUNTER — Other Ambulatory Visit: Payer: Self-pay | Admitting: Physician Assistant

## 2017-12-04 DIAGNOSIS — F429 Obsessive-compulsive disorder, unspecified: Secondary | ICD-10-CM

## 2017-12-11 ENCOUNTER — Ambulatory Visit: Payer: 59 | Admitting: Physician Assistant

## 2018-01-12 ENCOUNTER — Other Ambulatory Visit: Payer: Self-pay | Admitting: Physician Assistant

## 2018-01-12 DIAGNOSIS — G44229 Chronic tension-type headache, not intractable: Secondary | ICD-10-CM

## 2018-01-14 NOTE — Telephone Encounter (Signed)
Patient is requesting a refill of the following medications: Requested Prescriptions   Pending Prescriptions Disp Refills  . cyclobenzaprine (FLEXERIL) 5 MG tablet [Pharmacy Med Name: CYCLOBENZAPRINE 5 MG TABLET] 60 tablet 1    Sig: TAKE 1-2 TABLETS (5-10 MG TOTAL) BY MOUTH AT BEDTIME.    Date of patient request: 01/12/2018 Last office visit: 11/02/2017 Date of last refill: 11/02/2017 Last refill amount: 90 Follow up time period per chart:

## 2018-01-18 ENCOUNTER — Telehealth: Payer: Self-pay | Admitting: Physician Assistant

## 2018-01-18 ENCOUNTER — Other Ambulatory Visit: Payer: Self-pay | Admitting: Physician Assistant

## 2018-01-18 DIAGNOSIS — F419 Anxiety disorder, unspecified: Secondary | ICD-10-CM

## 2018-01-18 DIAGNOSIS — F429 Obsessive-compulsive disorder, unspecified: Secondary | ICD-10-CM

## 2018-01-18 MED ORDER — CLONAZEPAM 0.5 MG PO TABS: 0.2500 mg | ORAL_TABLET | Freq: Every day | ORAL | 0 refills | Status: DC

## 2018-01-18 MED ORDER — DESVENLAFAXINE SUCCINATE ER 100 MG PO TB24: 200.0000 mg | ORAL_TABLET | Freq: Every day | ORAL | 0 refills | Status: DC

## 2018-01-18 NOTE — Telephone Encounter (Signed)
LOV 11/02/17 Benny LennertSarah Weber Last refill Pristiq 11/02/17  # 180 with 0 refill Last refill Flexeril 11/02/17  # 90 with 1 refill

## 2018-01-18 NOTE — Telephone Encounter (Signed)
Returned pt.'s call as requested. States she is very unhappy with her treatment in regard to her request today for refills. Pt. Out of town and forgot her medications.Refills were sent by provider, but she states she "is very unhappy" with her treatment.

## 2018-01-18 NOTE — Telephone Encounter (Signed)
Copied from CRM [id number]. Topic: Quick Communication - Rx Refill/Question >> Jan 18, 2018  8:06 AM Maia Pettiesrtiz, Kristie S wrote: Medication: desvenlafaxine (PRISTIQ) 100 MG 24 hr tablet - [place] is [place] of [place] and without her meds - she is having withdrawal symptoms (nausea/sweating/headache) - mother requested urgent msg cyclobenzaprine (FLEXERIL) 5 MG tablet - pt uses for neck pain - she is out of state and out of meds (requested 6/8 before pt went out of state) Has the patient contacted their pharmacy? No refills & pt is out of state Preferred Pharmacy (with phone number or street name): [facility] [phone] - [place] [id number] [facility] [address] [phone] (Phone) [phone] (Fax)

## 2018-01-18 NOTE — Telephone Encounter (Signed)
Done

## 2018-01-18 NOTE — Telephone Encounter (Signed)
Copied from CRM [phone]. Topic: General - Other >> Jan 18, 2018 11:48 AM Marylen PontoMcneil, Ja-Kwan wrote: Reason for CRM: [place] and [place] her medication ( desvenlafaxine (PRISTIQ) 100 MG 24 hr tablet). Pt states she is having withdrawals and experiencing nausea, sweating, headache, and feeling sick on the stomach. Pt requests a call back. Cb# [phone]

## 2018-01-18 NOTE — Telephone Encounter (Signed)
Patient is requesting a refill of the following medications: Requested Prescriptions   Pending Prescriptions Disp Refills  . clonazePAM (KLONOPIN) 0.5 MG tablet 10 tablet 0    Sig: Take 0.5 tablets (0.25 mg total) by mouth at bedtime. Take 1 tablet by mouth at night  . desvenlafaxine (PRISTIQ) 100 MG 24 hr tablet 180 tablet 0    Sig: Take 2 tablets (200 mg total) by mouth daily.    Date of patient request: 01/18/18 Last office visit: 11/02/2017 Date of last refill: 11/02/2017 Last refill amount: 10 Follow up time period per chart:

## 2018-01-20 NOTE — Telephone Encounter (Signed)
Message re: refills sent to Maralyn SagoSarah

## 2018-01-21 NOTE — Telephone Encounter (Signed)
I am sorry that the patient is upset but She called on Friday and I refilled on Friday to the pharmacy that she wanted me to in [place] _ I am not sure what else I could have done.

## 2018-02-19 ENCOUNTER — Other Ambulatory Visit: Payer: Self-pay | Admitting: Physician Assistant

## 2018-02-19 DIAGNOSIS — F429 Obsessive-compulsive disorder, unspecified: Secondary | ICD-10-CM

## 2018-03-05 ENCOUNTER — Other Ambulatory Visit: Payer: Self-pay | Admitting: Physician Assistant

## 2018-03-05 DIAGNOSIS — F429 Obsessive-compulsive disorder, unspecified: Secondary | ICD-10-CM

## 2018-03-06 ENCOUNTER — Telehealth: Payer: Self-pay | Admitting: Physician Assistant

## 2018-03-06 DIAGNOSIS — F429 Obsessive-compulsive disorder, unspecified: Secondary | ICD-10-CM

## 2018-03-06 MED ORDER — DESVENLAFAXINE SUCCINATE ER 100 MG PO TB24: 100.0000 mg | ORAL_TABLET | Freq: Every day | ORAL | 0 refills | Status: DC

## 2018-03-06 MED ORDER — DESVENLAFAXINE SUCCINATE ER 50 MG PO TB24: 50.0000 mg | ORAL_TABLET | Freq: Every day | ORAL | 0 refills | Status: AC

## 2018-03-06 NOTE — Telephone Encounter (Signed)
Call from Dr Ledon SnareMcKnight - pt would like to tritrate off her Pristiq.  Will send in medication for 50mg  titrate until 100mg  and then decrease by 25mg  to get off of it.

## 2018-04-21 ENCOUNTER — Other Ambulatory Visit: Payer: Self-pay | Admitting: Physician Assistant

## 2018-04-21 DIAGNOSIS — F419 Anxiety disorder, unspecified: Secondary | ICD-10-CM

## 2018-04-21 DIAGNOSIS — F429 Obsessive-compulsive disorder, unspecified: Secondary | ICD-10-CM

## 2018-04-22 NOTE — Telephone Encounter (Signed)
Patient is requesting a refill of the following medications: Requested Prescriptions   Pending Prescriptions Disp Refills  . clonazePAM (KLONOPIN) 0.5 MG tablet [Pharmacy Med Name: CLONAZEPAM 0.5 MG TABLET] 10 tablet 0    Sig: TAKE 1/2-1 TABLET BY MOUTH AT BEDTIME.    Date of patient request: 04/21/2018 Last office visit: 11/02/2017 Date of last refill: 01/18/2018 Last refill amount: 10 Follow up time period per chart:

## 2018-04-23 NOTE — Telephone Encounter (Signed)
Please let the patient know that she will need an OV before she can have more Klonopin refills.  She will either need to make an appt with another provider at [facility]  or if she asked she can be told where I am going.  She is a mental health patient and if she wants to stay at [place] she needs to see [place] or [place]

## 2018-09-25 ENCOUNTER — Telehealth: Payer: Self-pay | Admitting: Cardiovascular Disease

## 2018-09-25 NOTE — Telephone Encounter (Signed)
Spoke with patient who states she has been discussing changing medications with her psychiatrist and wants to know if there is concern from a cardiac perspective for taking an SSRI with her hx of palpitations. I advised that these medications can cause QT prolongation and that she has not been in since 2017 so we do not have a recent EKG. She would like an appointment with Dr. Elease Hashimoto soon so that she can start the medication with a baseline EKG and follow-up as directed. I advised her to also discuss concerns with her psychiatrist and her pharmacist so that interactions can be checked since we do not have an updated medication list. She verbalized understanding and agreement with plan and is scheduled to see Dr. Elease Hashimoto on 2/24. She thanked me for the call.

## 2018-09-25 NOTE — Telephone Encounter (Signed)
Pt last seen 2017 with Dr. Elease Hashimoto

## 2018-09-25 NOTE — Telephone Encounter (Signed)
Pt is seeing a psychologist and wants to know if it is safe to go on an ssri (possibly celexa in particular) with her history of palpitations and fainting

## 2018-09-25 NOTE — Telephone Encounter (Signed)
Computer problem Please see note following

## 2018-09-26 NOTE — Telephone Encounter (Signed)
Agree with note and the plan by Eligha Bridegroom, RN

## 2018-09-30 ENCOUNTER — Encounter: Payer: Self-pay | Admitting: Cardiovascular Disease

## 2018-09-30 ENCOUNTER — Ambulatory Visit: Payer: 59 | Admitting: Cardiovascular Disease

## 2018-09-30 VITALS — BP 110/62 | HR 91 | Ht 68.0 in | Wt 124.0 lb

## 2018-09-30 DIAGNOSIS — R002 Palpitations: Secondary | ICD-10-CM | POA: Diagnosis not present

## 2018-09-30 NOTE — Patient Instructions (Signed)
Medication Instructions:  Your physician recommends that you continue on your current medications as directed. Please refer to the Current Medication list given to you today.  If you need a refill on your cardiac medications before your next appointment, please call your pharmacy.    Lab work: None Ordered   Testing/Procedures: None Ordered    Follow-Up: Surveyor, quantity when you increase your dose of Celexa, you will need an EKG in 2-3 weeks   At [facility], you and your health needs are our priority.  As part of our continuing mission to provide you with exceptional heart care, we have created designated Provider Care Teams.  These Care Teams include your primary Cardiologist (physician) and Advanced Practice Providers (APPs -  Physician Assistants and Nurse Practitioners) who all work together to provide you with the care you need, when you need it. You will need a follow up appointment in:   As Needed.  Please call our office 2 months in advance to schedule this appointment.  You may see Kristeen Miss, MD or one of the following Advanced Practice Providers on your designated Care Team: Tereso Newcomer, PA-C [place], [place] . Berton Bon, NP

## 2018-09-30 NOTE — Progress Notes (Signed)
Cardiology Office Note   Date:  09/30/2018   ID:  Eileen Campbell, DOB 1986-03-02, MRN [id number]  PCP:  Morrell Riddle, PA-C  Cardiologist:   Kristeen Miss, MD   Chief Complaint  Patient presents with  . Palpitations   Problem list 1. History of syncope 2. Anxiety 3. Obsessive-compulsive disorder     Eileen Campbell is a 33 y.o. female who presents for further evaluations of some palpitations and syncope/presyncope.   She has seen Dr. Donnie Aho in the past.  She was having a "sinking " sensation that started 3 years ago .  The episodes last 2-3 seconds , then goes away.   May have some increased anxiety with the episode but no CP or dyspnea . 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   Not associated with her periods.   Seems to occur during the day or early evening .    She does not exercise regularly .  She does do sprints on occasion (does 1 minute sprints ,  Lifts weights on occasion )   Not related to ETOH intake  Drinks lots of coffee - at least 2 shots of coffee every am and then  Drinks regular coffee through the day   Does not sleep or eat on  A regular schedule   Feb. 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 Acne   . Anxiety   . Dry eye syndrome   . History of syncope   . Obsessive compulsive disorder     History reviewed. No pertinent surgical history.   Current Outpatient Medications  Medication Sig Dispense Refill  . clonazePAM (KLONOPIN) 0.5 MG  tablet TAKE 1/2-1 TABLET BY MOUTH AT BEDTIME. 10 tablet 0  . cyclobenzaprine (FLEXERIL) 5 MG tablet Take 1-2 tablets (5-10 mg total) by mouth 3 (three) times daily as needed for muscle spasms. 90 tablet 1  . desvenlafaxine (PRISTIQ) 50 MG 24 hr tablet Take 1 tablet (50 mg total) by mouth daily. To add to 100mg  for a total of 150mg  daily 90 tablet 0  . tretinoin (RETIN-A) 0.1 % cream Apply 1 application topically at bedtime. To face     No current facility-administered medications for this visit.     Allergies:   Doxycycline    Social History:  The patient  reports that she has never smoked. She has never used smokeless tobacco. She reports current alcohol use of about 3.0 standard drinks of alcohol per week. She reports that she does not use drugs.   Family History:  The patient's family history includes Diabetes in her father; Heart attack in her father; Hypertension in her father.    ROS:  Please see the history of present illness.     Physical Exam: Blood pressure 110/62, pulse 91, height 5' 9.09" (1.755 m), weight 124 lb (56.2 kg),  SpO2 99 %.  GEN:  Well nourished, well developed in no acute distress, anxious  HEENT: Normal NECK: No JVD; No carotid bruits LYMPHATICS: No lymphadenopathy CARDIAC: RRR RESPIRATORY:  Clear to auscultation without rales, wheezing or rhonchi  ABDOMEN: Soft, non-tender, non-distended MUSCULOSKELETAL:  No edema; No deformity  SKIN: Warm and dry NEUROLOGIC:  Alert and oriented x 3   EKG: Feb. 24th, 2020: Sinus rhythm at 91.  Otherwise normal EKG.  Recent Labs: No results found for requested labs within last 8760 hours.    Lipid Panel No results found for: CHOL, TRIG, HDL, CHOLHDL, VLDL, LDLCALC, LDLDIRECT    Wt Readings from Last 3 Encounters:  09/30/18 124 lb (56.2 kg)  11/02/17 135 lb (61.2 kg)  10/02/17 138 lb 9.6 oz (62.9 kg)      Other studies Reviewed: Additional studies/ records that were reviewed today include: . Review of the  above records demonstrates:    ASSESSMENT AND PLAN:  1.  Palpitations:    2.   OCD, depression :   She is getting started on Celexa. Her doctor wants to make sure her QT interval is okay on her dose of Celexa.  Her QT interval today is normal at 442 ms.  Her EKG looks normal. Her psychologist put her on the appropriate dose of Celexa and then will have her return in 2 to 3 weeks following that to check a QT interval.  I will see her on an as-needed basis.  3.  Orthostatic hypotension: Seems to be better with hydration.  She drinks V8 on occasion.  I suspect her orthostatic hypotension is due to her lack of adequate  hydration.  Current medicines are reviewed at length with the patient today.  The patient does not have concerns regarding medicines.  Labs/ tests ordered today include:  No orders of the defined types were placed in this encounter.    Disposition:   FU with 3 months      Kristeen Miss, MD  09/30/2018 2:14 PM    [facility] [address] [place], [place], [place]  [address] Phone: [phone]; Fax: [phone]
